# Patient Record
Sex: Female | Born: 1990 | Race: White | Hispanic: No | Marital: Married | Smoking: Never smoker
Health system: Southern US, Community
[De-identification: ages and names within clinical notes are randomized; demographics above are authoritative.]

---

## 2020-07-08 ENCOUNTER — Encounter (HOSPITAL_COMMUNITY): Payer: Self-pay | Admitting: Obstetrics and Gynecology

## 2020-07-08 ENCOUNTER — Emergency Department (HOSPITAL_COMMUNITY): Payer: 59

## 2020-07-08 ENCOUNTER — Other Ambulatory Visit: Payer: Self-pay

## 2020-07-08 ENCOUNTER — Emergency Department (HOSPITAL_COMMUNITY)
Admission: EM | Admit: 2020-07-08 | Discharge: 2020-07-08 | Disposition: A | Discharge status: Home / Self Care | Payer: 59 | Attending: Emergency Medicine | Admitting: Emergency Medicine

## 2020-07-08 DIAGNOSIS — R58 Hemorrhage, not elsewhere classified: Secondary | ICD-10-CM

## 2020-07-08 DIAGNOSIS — Z2914 Encounter for prophylactic rabies immune globin: Secondary | ICD-10-CM | POA: Insufficient documentation

## 2020-07-08 DIAGNOSIS — O3091 Multiple gestation, unspecified, first trimester: Secondary | ICD-10-CM | POA: Diagnosis not present

## 2020-07-08 DIAGNOSIS — O418X11 Other specified disorders of amniotic fluid and membranes, first trimester, fetus 1: Secondary | ICD-10-CM

## 2020-07-08 DIAGNOSIS — O4691 Antepartum hemorrhage, unspecified, first trimester: Secondary | ICD-10-CM | POA: Insufficient documentation

## 2020-07-08 DIAGNOSIS — O468X1 Other antepartum hemorrhage, first trimester: Secondary | ICD-10-CM

## 2020-07-08 DIAGNOSIS — Z3A11 11 weeks gestation of pregnancy: Secondary | ICD-10-CM | POA: Insufficient documentation

## 2020-07-08 LAB — CBC WITH DIFFERENTIAL/PLATELET
Abs Immature Granulocytes: 0.04 10*3/uL (ref 0.00–0.07)
Basophils Absolute: 0.1 10*3/uL (ref 0.0–0.1)
Basophils Relative: 1 %
Eosinophils Absolute: 0.2 10*3/uL (ref 0.0–0.5)
Eosinophils Relative: 2 %
HCT: 40.1 % (ref 36.0–46.0)
Hemoglobin: 13.5 g/dL (ref 12.0–15.0)
Immature Granulocytes: 0 %
Lymphocytes Relative: 16 %
Lymphs Abs: 1.6 10*3/uL (ref 0.7–4.0)
MCH: 30.1 pg (ref 26.0–34.0)
MCHC: 33.7 g/dL (ref 30.0–36.0)
MCV: 89.5 fL (ref 80.0–100.0)
Monocytes Absolute: 0.6 10*3/uL (ref 0.1–1.0)
Monocytes Relative: 6 %
Neutro Abs: 8 10*3/uL — ABNORMAL HIGH (ref 1.7–7.7)
Neutrophils Relative %: 75 %
Platelets: 217 10*3/uL (ref 150–400)
RBC: 4.48 MIL/uL (ref 3.87–5.11)
RDW: 12.7 % (ref 11.5–15.5)
WBC: 10.5 10*3/uL (ref 4.0–10.5)
nRBC: 0 % (ref 0.0–0.2)

## 2020-07-08 LAB — WET PREP, GENITAL
Clue Cells Wet Prep HPF POC: NONE SEEN
Sperm: NONE SEEN
Trich, Wet Prep: NONE SEEN
Yeast Wet Prep HPF POC: NONE SEEN

## 2020-07-08 LAB — BASIC METABOLIC PANEL
Anion gap: 7 (ref 5–15)
BUN: 10 mg/dL (ref 6–20)
CO2: 22 mmol/L (ref 22–32)
Calcium: 8.7 mg/dL — ABNORMAL LOW (ref 8.9–10.3)
Chloride: 106 mmol/L (ref 98–111)
Creatinine, Ser: 0.59 mg/dL (ref 0.44–1.00)
GFR, Estimated: >60 mL/min (ref >60)
Glucose, Bld: 79 mg/dL (ref 70–99)
Potassium: 3.5 mmol/L (ref 3.5–5.1)
Sodium: 135 mmol/L (ref 135–145)

## 2020-07-08 LAB — URINALYSIS, ROUTINE W REFLEX MICROSCOPIC
Bacteria, UA: NONE SEEN
Bilirubin Urine: NEGATIVE
Glucose, UA: NEGATIVE mg/dL
Ketones, ur: NEGATIVE mg/dL
Leukocytes,Ua: NEGATIVE
Nitrite: NEGATIVE
Protein, ur: NEGATIVE mg/dL
Specific Gravity, Urine: 1.006 (ref 1.005–1.030)
pH: 7 (ref 5.0–8.0)

## 2020-07-08 LAB — HCG, QUANTITATIVE, PREGNANCY: hCG, Beta Chain, Quant, S: 165612 m[IU]/mL — ABNORMAL HIGH (ref <5)

## 2020-07-08 MED ORDER — RHO D IMMUNE GLOBULIN 1500 UNIT/2ML IJ SOSY
300.0000 ug | PREFILLED_SYRINGE | Freq: Once | INTRAMUSCULAR | Status: AC
  Administered 2020-07-08: 300 ug via INTRAMUSCULAR
  Filled 2020-07-08: qty 2

## 2020-07-08 NOTE — ED Triage Notes (Signed)
Patient repots to the ER for vaginal bleeding. Patient reports it started early this morning and has not been enough to fill a pad. Patient reports it is bright red in nature. Patient reports she is Rh (-). EDD June 4th 2022. Patient denies abdominal pain.

## 2020-07-08 NOTE — Discharge Instructions (Signed)
I would suggest you rest and no sexual intercourse until you follow-up with OB.  If you develop any increased bleeding with bleeding through a pad within 2 hours please seek reevaluation.  Donna Walsh does have an [facility] emergency department the women's and children's entrance.  This is separate from the emergency department.  If need be will be assessed by an obstetrician there.

## 2020-07-08 NOTE — ED Notes (Signed)
Verbal permission given by pt to administer RhoGam

## 2020-07-08 NOTE — ED Notes (Signed)
US at bedside

## 2020-07-08 NOTE — ED Provider Notes (Signed)
[facility]-EMERGENCY DEPT Provider Note   CSN: [id number] Arrival date & time: 07/08/20  3716    History Chief Complaint  Patient presents with  . Vaginal Bleeding    Donna Walsh is a 29 y.o. female G3 P2 currently residing in [place] here on vacation presents for evaluation of vaginal bleeding.  Patient approximately 11 weeks by dates.  She has had ultrasound with her OB/GYN which establish an IUP.  She is Rh-.  Has had to receive RhoGam in her prior pregnancies.  She denies any nausea, vomiting, diarrhea, abdominal pain, dysuria, flank pain.  No concerns for STDs.  No vaginal discharge.  Patient states it was bright red in nature when she wipes.  Was not enough to saturate a pad.  Denies additional aggravating or alleviating factors.  History obtained from patient and past medical records. No interpreter is used.  HPI     History reviewed. No pertinent past medical history.  There are no problems to display for this patient.   History reviewed. No pertinent surgical history.   OB History    Gravida  1   Para      Term      Preterm      AB      Living        SAB      TAB      Ectopic      Multiple      Live Births              History reviewed. No pertinent family history.  Social History   Tobacco Use  . Smoking status: Never Smoker  Substance Use Topics  . Alcohol use: Not Currently  . Drug use: Not Currently    Home Medications Prior to Admission medications   Medication Sig Start Date End Date Taking? Authorizing Provider  Prenatal Vit-Fe Fumarate-FA (MULTIVITAMIN-PRENATAL) 27-0.8 MG TABS tablet Take 1 tablet by mouth daily.   Yes [provider]    Allergies    Patient has no known allergies.  Review of Systems   Review of Systems  Constitutional: Negative.   HENT: Negative.   Respiratory: Negative.   Cardiovascular: Negative.   Gastrointestinal: Negative.   Genitourinary: Positive for  vaginal bleeding. Negative for decreased urine volume, difficulty urinating, dyspareunia, dysuria, enuresis, flank pain, frequency, genital sores, hematuria, menstrual problem, pelvic pain, urgency, vaginal discharge and vaginal pain.  Musculoskeletal: Negative.   Skin: Negative.   Neurological: Negative.   All other systems reviewed and are negative.   Physical Exam Updated Vital Signs BP 111/76   Pulse 91   Temp 97.9 F (36.6 C) (Oral)   Resp 16   LMP  (Exact Date)   SpO2 100%   Physical Exam Vitals and nursing note reviewed.  Constitutional:      General: She is not in acute distress.    Appearance: She is well-developed. She is not ill-appearing, toxic-appearing or diaphoretic.  HENT:     Head: Normocephalic and atraumatic.     Nose: Nose normal.     Mouth/Throat:     Mouth: Mucous membranes are moist.  Eyes:     Pupils: Pupils are equal, round, and reactive to light.  Cardiovascular:     Rate and Rhythm: Normal rate.     Pulses: Normal pulses.     Heart sounds: Normal heart sounds.  Pulmonary:     Effort: Pulmonary effort is normal. No respiratory distress.     Breath  sounds: Normal breath sounds.  Abdominal:     General: Bowel sounds are normal. There is no distension.     Palpations: Abdomen is soft.     Tenderness: There is no abdominal tenderness. There is no right CVA tenderness, left CVA tenderness, guarding or rebound. Negative signs include Murphy's sign and McBurney's sign.     Hernia: No hernia is present.  Genitourinary:    Comments: Normal appearing external female genitalia without rashes or lesions, normal vaginal epithelium. Normal appearing cervix without discharge or petechiae. Cervical os has bleeding at os. There is mild bright red bleeding noted at the os. No odor. Bimanual: No CMT, nontender.  No palpable adnexal masses or tenderness. Uterus midline and not fixed. Rectovaginal exam was deferred.  No cystocele or rectocele noted. No pelvic  lymphadenopathy noted. Wet prep was obtained.  Cultures for gonorrhea and chlamydia collected. Exam performed with chaperone in room. Musculoskeletal:        General: Normal range of motion.     Cervical back: Normal range of motion.  Skin:    General: Skin is warm and dry.     Capillary Refill: Capillary refill takes less than 2 seconds.  Neurological:     General: No focal deficit present.     Mental Status: She is alert and oriented to person, place, and time.    ED Results / Procedures / Treatments   Labs (all labs ordered are listed, but only abnormal results are displayed) Labs Reviewed  WET PREP, GENITAL - Abnormal; Notable for the following components:      Result Value   WBC, Wet Prep HPF POC FEW (*)    All other components within normal limits  CBC WITH DIFFERENTIAL/PLATELET - Abnormal; Notable for the following components:   Neutro Abs 8.0 (*)    All other components within normal limits  BASIC METABOLIC PANEL - Abnormal; Notable for the following components:   Calcium 8.7 (*)    All other components within normal limits  HCG, QUANTITATIVE, PREGNANCY - Abnormal; Notable for the following components:   hCG, Beta Chain, Quant, [place] 315,176 (*)    All other components within normal limits  URINALYSIS, ROUTINE W REFLEX MICROSCOPIC - Abnormal; Notable for the following components:   Color, Urine STRAW (*)    Hgb urine dipstick MODERATE (*)    All other components within normal limits  ABO/RH  RH IG WORKUP (INCLUDES ABO/RH)  GC/CHLAMYDIA PROBE AMP ([place]) NOT AT [facility]    EKG None  Radiology US OB LESS THAN 14 WEEKS W/ OB TRANSVAGINAL AND DOPPLER  Result Date: 07/08/2020 CLINICAL DATA:  Pelvic pain and vaginal bleeding. EXAM: OBSTETRIC <14 WK [place] AND TRANSVAGINAL OB US DOPPLER ULTRASOUND OF OVARIES TECHNIQUE: Both transabdominal and transvaginal ultrasound examinations were performed for complete evaluation of the gestation as well as the maternal uterus, adnexal  regions, and pelvic cul-de-sac. Transvaginal technique was performed to assess early pregnancy. Color and duplex Doppler ultrasound was utilized to evaluate blood flow to the ovaries. COMPARISON:  None. FINDINGS: Intrauterine gestational sac: Single Yolk sac:  Not Visualized. Embryo:  Visualized. Cardiac Activity: Visualized. Heart Rate: 171 bpm CRL:   38 mm   10 w 5 d                  [place] EDC: 01/29/2021 Subchorionic hemorrhage: Moderate subchorionic hemorrhage is seen measuring 4.2 x 2.7 x 3.4 cm. Maternal uterus/adnexae: Normal appearance of both ovaries. No mass or abnormal free fluid identified. Pulsed  Doppler evaluation of both ovaries demonstrates normal appearing low-resistance arterial and venous waveforms. IMPRESSION: Single living IUP with estimated gestational age of [redacted] weeks 5 days, and [place] EDC of 01/29/2021. Moderate subchorionic hemorrhage. No sonographic evidence for ovarian torsion or mass. Electronically Signed   By: Danae Orleans M.D.   On: 07/08/2020 14:10    Procedures Procedures (including critical care time)  Medications Ordered in ED Medications  rho (d) immune globulin (RHIG/RHOPHYLAC) injection 300 mcg (300 mcg Intramuscular Given 07/08/20 1530)    ED Course  I have reviewed the triage vital signs and the nursing notes.  Pertinent labs & imaging results that were available during my care of the patient were reviewed by me and considered in my medical decision making (see chart for details).  G3P2 currently [redacted] weeks pregnant presents for evaluation of bright red vaginal spotting.  No current abdominal pain.  Currently residing in [place].  Here on vacation visiting family.  No prior miscarriages.  No vaginal discharge, fluid leakage.  Bleeding when she wiped.  She denies any hematuria, difficulty with urinating.  Rh-.  Received RhoGam prior pregnancies.  Plan on labs, imaging, pelvic and reassess.  Labs and imaging personally reviewed and interpreted: UA with blood likely  from vaginal bleeding Rh O- will give RhoGam Wet prep with few WBC HCG 165,612 CBC without leukocytosis Metabolic panel with electrolyte, renal or liver abnormality  Ultrasound with single IUP at 10 weeks 5 days.  She has moderate subchorionic hemorrhage.  Given Rhogam  Discussed results with patient and husband in room.  Discussed anticipatory guidance. Pelvic rest.  She will follow-up with OB.  Discussed follow-up with [facility] for any worsening symptoms.  The patient has been appropriately medically screened and/or stabilized in the ED. I have low suspicion for any other emergent medical condition which would require further screening, evaluation or treatment in the ED or require inpatient management.  Patient is hemodynamically stable and in no acute distress.  Patient able to ambulate in department prior to ED.  Evaluation does not show acute pathology that would require ongoing or additional emergent interventions while in the emergency department or further inpatient treatment.  I have discussed the diagnosis with the patient and answered all questions.  Pain is been managed while in the emergency department and patient has no further complaints prior to discharge.  Patient is comfortable with plan discussed in room and is stable for discharge at this time.  I have discussed strict return precautions for returning to the emergency department.  Patient was encouraged to follow-up with PCP/specialist refer to at discharge.    MDM Rules/Calculators/A&P                           Final Clinical Impression(s) / ED Diagnoses Final diagnoses:  Subchorionic hemorrhage of placenta in first trimester, fetus 1 of multiple gestation    Rx / DC Orders ED Discharge Orders    None       Jordynne Mccown A, PA-C 07/08/20 1659    Lorre Nick, MD 07/13/20 1354

## 2020-07-09 LAB — RH IG WORKUP (INCLUDES ABO/RH)
ABO/RH(D): O NEG
Antibody Screen: NEGATIVE
Gestational Age(Wks): 11
Unit division: 0

## 2020-07-09 LAB — GC/CHLAMYDIA PROBE AMP (~~LOC~~) NOT AT ARMC
Chlamydia: NEGATIVE
Comment: NEGATIVE
Comment: NORMAL
Neisseria Gonorrhea: NEGATIVE

## 2020-07-09 LAB — ABO/RH: ABO/RH(D): O NEG

## 2022-04-21 IMAGING — US US OB < 14 WKS - US OB TV - US DOPPLER
1 series · 13 of 28 positions shown · non-contrast
Comparison: None.

CLINICAL DATA: Pelvic pain and vaginal bleeding.



[Series 1: us ob < 14 wks - us ob tv - us doppler · 80 acquisitions, 13 frames shown]
[im 3/80]
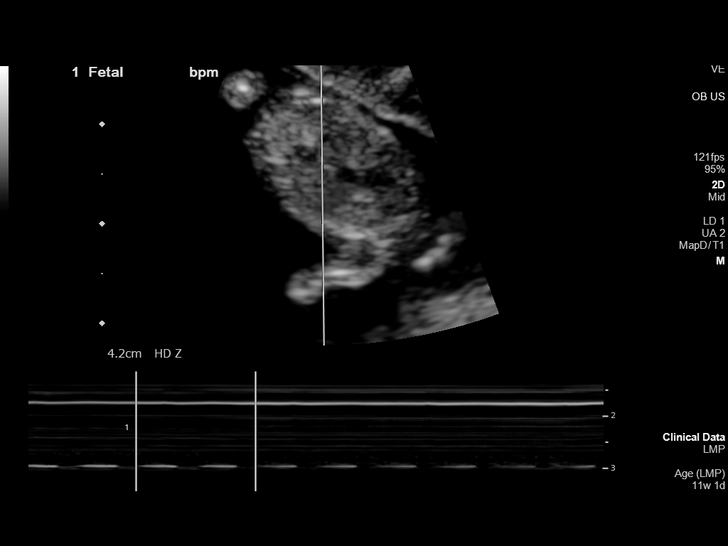
[im 9/80]
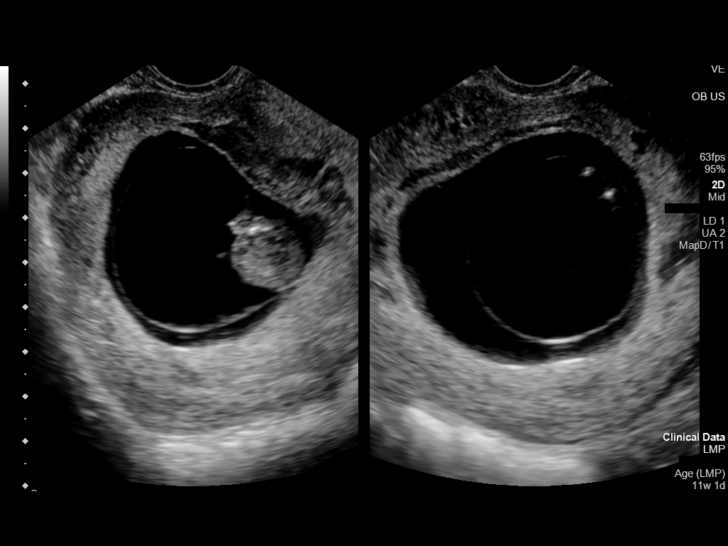
[im 15/80]
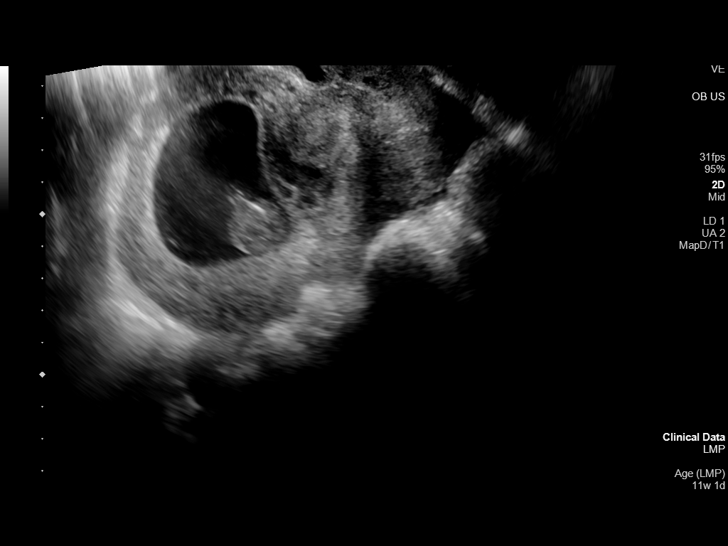
[im 21/80]
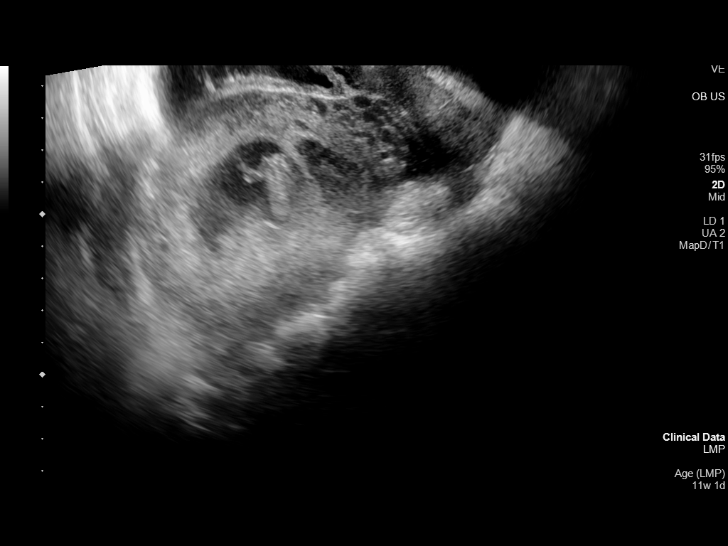
[im 27/80]
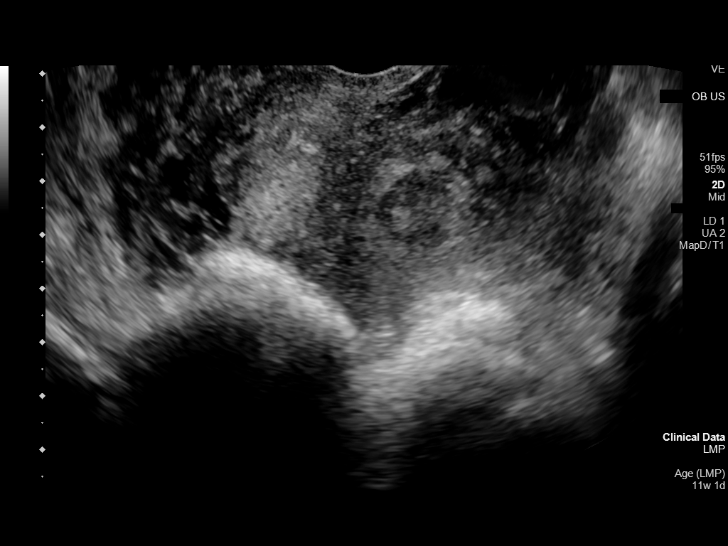
[im 33/80]
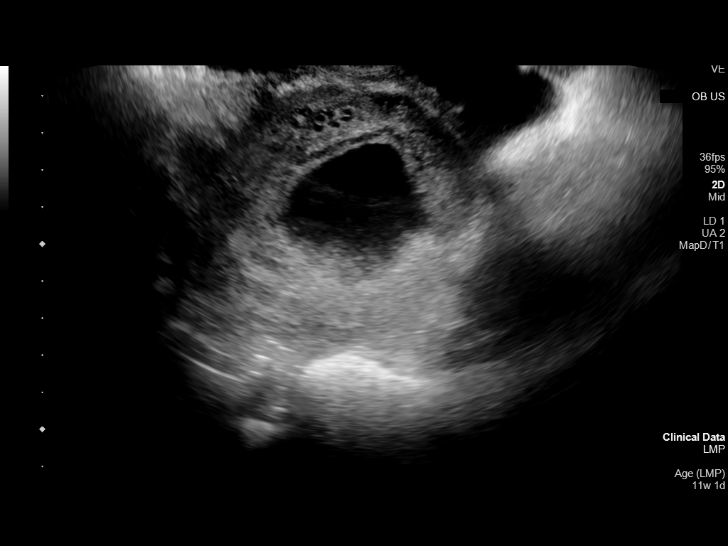
[im 41/80]
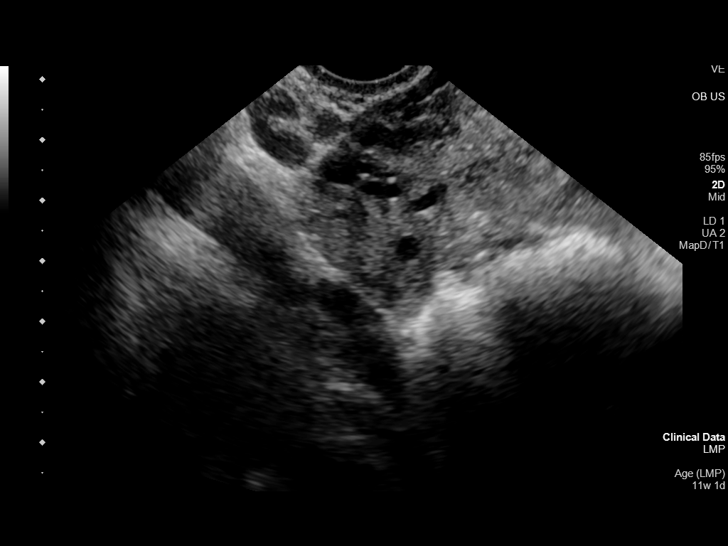
[im 47/80]
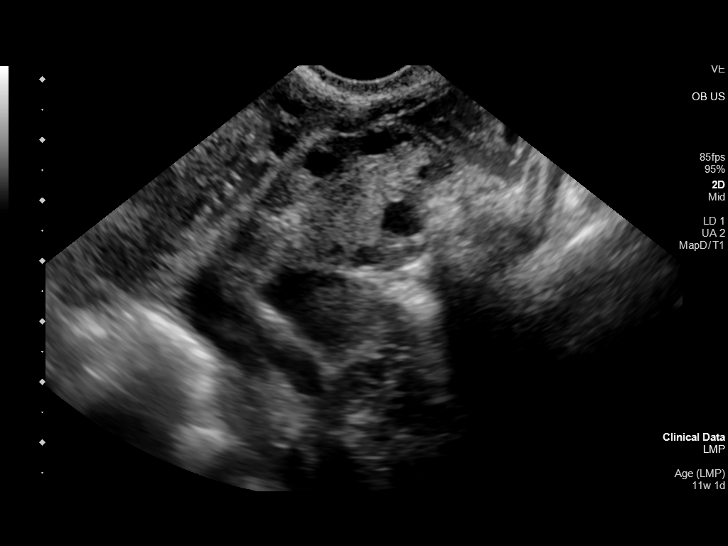
[im 53/80]
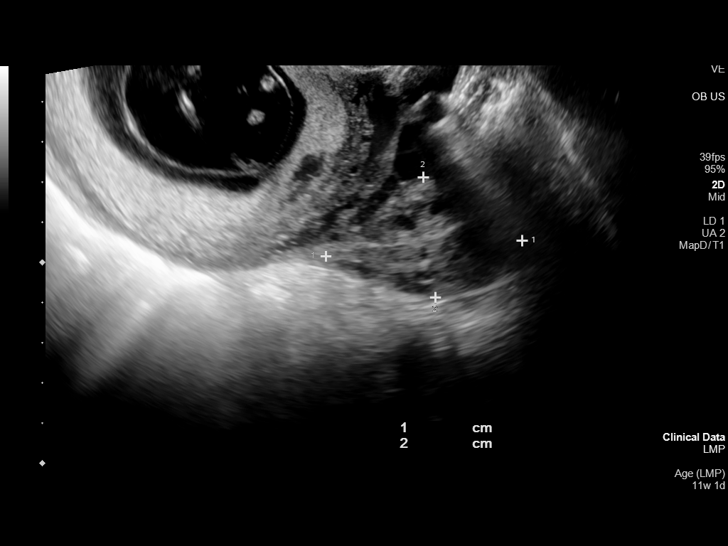
[im 59/80]
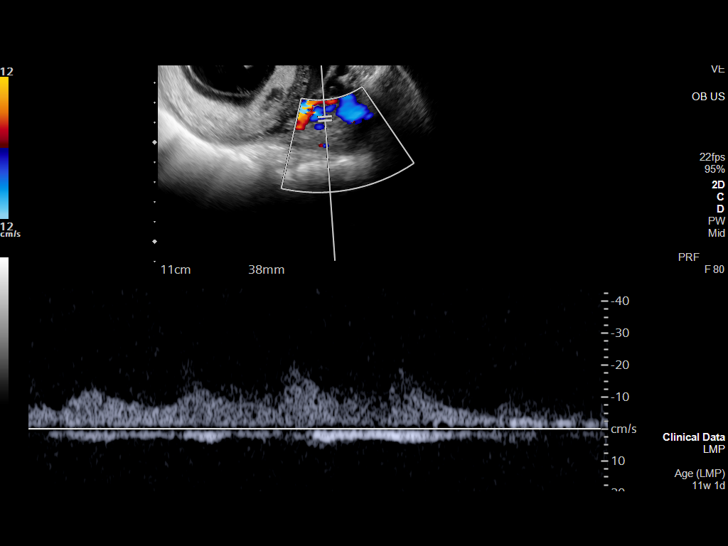
[im 65/80]
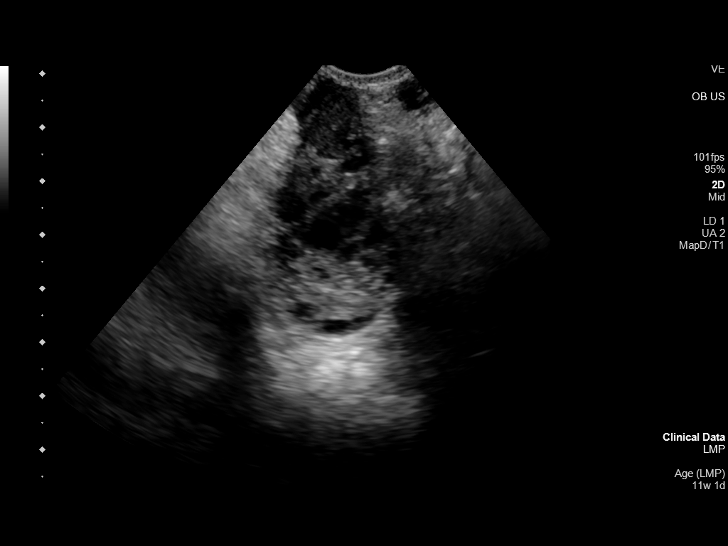
[im 71/80]
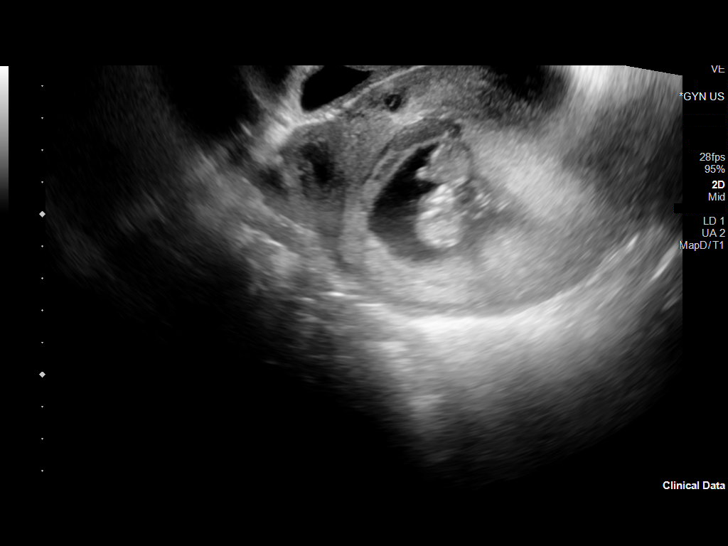
[im 77/80]
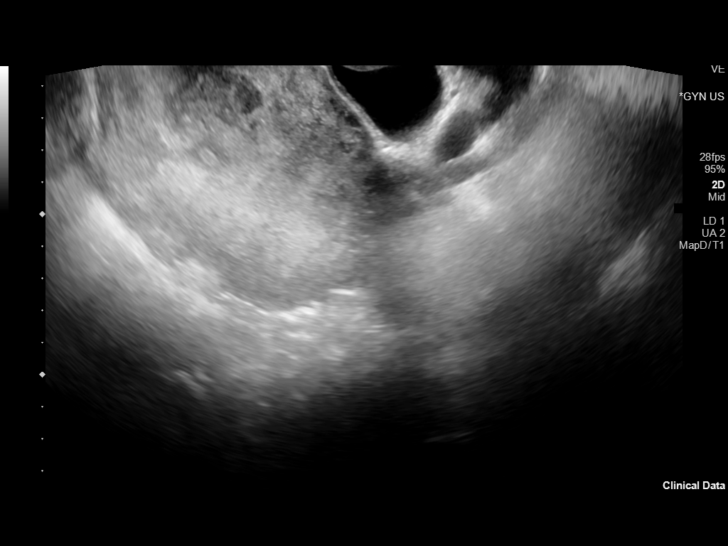

[13 of 28 positions shown; findings below may reference images not displayed]

FINDINGS: Intrauterine gestational sac: Single

Yolk sac:  Not Visualized.

Embryo:  Visualized.

Cardiac Activity: Visualized.

Heart Rate: 171 bpm

CRL:   38 mm   10 w 5 d                  US EDC: 01/29/2021

Subchorionic hemorrhage: Moderate subchorionic hemorrhage is seen
measuring 4.2 x 2.7 x 3.4 cm.

Maternal uterus/adnexae: Normal appearance of both ovaries. No mass
or abnormal free fluid identified.

Pulsed Doppler evaluation of both ovaries demonstrates normal
appearing low-resistance arterial and venous waveforms.
IMPRESSION: Single living IUP with estimated gestational age of 10 weeks 5 days,
and US EDC of 01/29/2021.

Moderate subchorionic hemorrhage.

No sonographic evidence for ovarian torsion or mass.
